# Patient Record
Sex: Male | Born: 1935 | Race: White | Hispanic: No
Health system: Southern US, Community
[De-identification: ages and names within clinical notes are randomized; demographics above are authoritative.]

---

## 2004-10-08 ENCOUNTER — Ambulatory Visit: Payer: Self-pay | Admitting: Unknown Physician Specialty

## 2011-01-10 ENCOUNTER — Observation Stay: Payer: Self-pay | Admitting: Internal Medicine

## 2011-03-06 ENCOUNTER — Ambulatory Visit: Payer: Self-pay | Admitting: Orthopedic Surgery

## 2011-03-11 ENCOUNTER — Ambulatory Visit: Payer: Self-pay | Admitting: Unknown Physician Specialty

## 2011-03-11 LAB — URINALYSIS, COMPLETE
Bilirubin,UR: NEGATIVE
Blood: NEGATIVE
Glucose,UR: >=500 mg/dL
Ketone: NEGATIVE
Nitrite: NEGATIVE
Ph: 5
Protein: NEGATIVE
RBC,UR: 3 /HPF
Specific Gravity: 1.026
Squamous Epithelial: NONE SEEN
WBC UR: 20 /HPF

## 2011-03-17 ENCOUNTER — Ambulatory Visit: Payer: Self-pay | Admitting: Unknown Physician Specialty

## 2011-03-19 LAB — PATHOLOGY REPORT

## 2012-05-01 ENCOUNTER — Inpatient Hospital Stay: Payer: Self-pay | Admitting: Internal Medicine

## 2012-05-01 LAB — CBC
HCT: 40.7 % (ref 40.0–52.0)
HGB: 13.7 g/dL (ref 13.0–18.0)
MCV: 95 fL (ref 80–100)
RBC: 4.27 10*6/uL — ABNORMAL LOW (ref 4.40–5.90)
RDW: 12.9 % (ref 11.5–14.5)
WBC: 7.4 10*3/uL (ref 3.8–10.6)

## 2012-05-01 LAB — COMPREHENSIVE METABOLIC PANEL
Anion Gap: 5 — ABNORMAL LOW (ref 7–16)
BUN: 19 mg/dL — ABNORMAL HIGH (ref 7–18)
Bilirubin,Total: 0.4 mg/dL (ref 0.2–1.0)
Calcium, Total: 8.7 mg/dL (ref 8.5–10.1)
Creatinine: 1.14 mg/dL (ref 0.60–1.30)
EGFR (African American): >60
EGFR (Non-African Amer.): 55 — ABNORMAL LOW
Glucose: 336 mg/dL — ABNORMAL HIGH (ref 65–99)
Osmolality: 284 (ref 275–301)
Potassium: 4.7 mmol/L (ref 3.5–5.1)
SGOT(AST): 16 U/L (ref 15–37)
SGPT (ALT): 17 U/L (ref 12–78)
Sodium: 134 mmol/L — ABNORMAL LOW (ref 136–145)

## 2012-05-01 LAB — URINALYSIS, COMPLETE
Bacteria: NONE SEEN
Bilirubin,UR: NEGATIVE
Blood: NEGATIVE
Glucose,UR: >=500 mg/dL (ref 0–75)
Leukocyte Esterase: NEGATIVE
Nitrite: NEGATIVE
Ph: 5 (ref 4.5–8.0)
Squamous Epithelial: <1

## 2012-05-02 LAB — BASIC METABOLIC PANEL
Calcium, Total: 8.4 mg/dL — ABNORMAL LOW (ref 8.5–10.1)
Chloride: 103 mmol/L (ref 98–107)
Co2: 31 mmol/L (ref 21–32)
Creatinine: 0.96 mg/dL (ref 0.60–1.30)
EGFR (African American): >60
EGFR (Non-African Amer.): >60
Glucose: 185 mg/dL — ABNORMAL HIGH (ref 65–99)
Potassium: 4 mmol/L (ref 3.5–5.1)

## 2012-05-02 LAB — LIPID PANEL
Cholesterol: 180 mg/dL (ref 0–200)
HDL Cholesterol: 50 mg/dL (ref 40–60)
Ldl Cholesterol, Calc: 103 mg/dL — ABNORMAL HIGH (ref 0–100)

## 2012-05-02 LAB — MAGNESIUM: Magnesium: 1.9 mg/dL

## 2012-08-09 ENCOUNTER — Inpatient Hospital Stay: Payer: Self-pay | Admitting: Internal Medicine

## 2012-08-09 LAB — COMPREHENSIVE METABOLIC PANEL
Anion Gap: 10 (ref 7–16)
Chloride: 105 mmol/L (ref 98–107)
Co2: 26 mmol/L (ref 21–32)
Creatinine: 1.45 mg/dL — ABNORMAL HIGH (ref 0.60–1.30)
EGFR (African American): 48 — ABNORMAL LOW
EGFR (Non-African Amer.): 41 — ABNORMAL LOW
Glucose: 269 mg/dL — ABNORMAL HIGH (ref 65–99)
Osmolality: 307 (ref 275–301)
SGOT(AST): 20 U/L (ref 15–37)
SGPT (ALT): 13 U/L (ref 12–78)
Sodium: 141 mmol/L (ref 136–145)

## 2012-08-09 LAB — CBC WITH DIFFERENTIAL/PLATELET
Basophil #: 0 10*3/uL (ref 0.0–0.1)
Basophil %: 0.3 %
Eosinophil #: 0 10*3/uL (ref 0.0–0.7)
Eosinophil %: 0.3 %
HCT: 36.6 % — ABNORMAL LOW (ref 40.0–52.0)
Lymphocyte #: 0.7 10*3/uL — ABNORMAL LOW (ref 1.0–3.6)
Lymphocyte %: 6 %
MCH: 32.4 pg (ref 26.0–34.0)
MCV: 95 fL (ref 80–100)
Monocyte #: 0.8 x10 3/mm (ref 0.2–1.0)
Monocyte %: 6.8 %
Neutrophil #: 10.3 10*3/uL — ABNORMAL HIGH (ref 1.4–6.5)
Platelet: 206 10*3/uL (ref 150–440)
RBC: 3.86 10*6/uL — ABNORMAL LOW (ref 4.40–5.90)
RDW: 13.1 % (ref 11.5–14.5)
WBC: 11.9 10*3/uL — ABNORMAL HIGH (ref 3.8–10.6)

## 2012-08-09 LAB — CK TOTAL AND CKMB (NOT AT ARMC)
CK, Total: 185 U/L (ref 35–232)
CK, Total: 201 U/L (ref 35–232)
CK-MB: 3.2 ng/mL (ref 0.5–3.6)

## 2012-08-09 LAB — LIPID PANEL
HDL Cholesterol: 44 mg/dL (ref 40–60)
Ldl Cholesterol, Calc: 79 mg/dL (ref 0–100)
Triglycerides: 196 mg/dL (ref 0–200)

## 2012-08-09 LAB — APTT: Activated PTT: 29.3 secs (ref 23.6–35.9)

## 2012-08-09 LAB — MAGNESIUM: Magnesium: 2 mg/dL

## 2012-08-10 LAB — CBC WITH DIFFERENTIAL/PLATELET
Eosinophil #: 0.2 10*3/uL (ref 0.0–0.7)
Eosinophil %: 2.3 %
HGB: 11.5 g/dL — ABNORMAL LOW (ref 13.0–18.0)
MCH: 32.5 pg (ref 26.0–34.0)
MCHC: 35 g/dL (ref 32.0–36.0)
MCV: 93 fL (ref 80–100)
Monocyte #: 1.1 x10 3/mm — ABNORMAL HIGH (ref 0.2–1.0)
Monocyte %: 11.2 %
Neutrophil #: 7.3 10*3/uL — ABNORMAL HIGH (ref 1.4–6.5)
Neutrophil %: 74.8 %
Platelet: 225 10*3/uL (ref 150–440)
RBC: 3.54 10*6/uL — ABNORMAL LOW (ref 4.40–5.90)
WBC: 9.8 10*3/uL (ref 3.8–10.6)

## 2012-08-10 LAB — BASIC METABOLIC PANEL
Anion Gap: 9 (ref 7–16)
Anion Gap: 7 (ref 7–16)
BUN: 37 mg/dL — ABNORMAL HIGH (ref 7–18)
Calcium, Total: 8.2 mg/dL — ABNORMAL LOW (ref 8.5–10.1)
Chloride: 103 mmol/L (ref 98–107)
Chloride: 111 mmol/L — ABNORMAL HIGH (ref 98–107)
Co2: 23 mmol/L (ref 21–32)
Creatinine: 1.1 mg/dL (ref 0.60–1.30)
EGFR (Non-African Amer.): 45 — ABNORMAL LOW
Osmolality: 287 (ref 275–301)
Potassium: 2.8 mmol/L — ABNORMAL LOW (ref 3.5–5.1)
Potassium: 5 mmol/L (ref 3.5–5.1)
Sodium: 138 mmol/L (ref 136–145)
Sodium: 141 mmol/L (ref 136–145)

## 2012-08-10 LAB — URINALYSIS, COMPLETE
Bilirubin,UR: NEGATIVE
Ketone: NEGATIVE
Leukocyte Esterase: NEGATIVE
Nitrite: NEGATIVE
Protein: 30
RBC,UR: 975 /HPF (ref 0–5)
WBC UR: 4 /HPF (ref 0–5)

## 2012-08-10 LAB — TROPONIN I: Troponin-I: 0.62 ng/mL — ABNORMAL HIGH

## 2012-08-10 LAB — CK-MB: CK-MB: 3.6 ng/mL (ref 0.5–3.6)

## 2012-08-10 LAB — CK: CK, Total: 197 U/L (ref 35–232)

## 2012-08-11 LAB — COMPREHENSIVE METABOLIC PANEL
Albumin: 2.2 g/dL — ABNORMAL LOW (ref 3.4–5.0)
Alkaline Phosphatase: 67 U/L (ref 50–136)
Anion Gap: 11 (ref 7–16)
BUN: 27 mg/dL — ABNORMAL HIGH (ref 7–18)
Bilirubin,Total: 0.4 mg/dL (ref 0.2–1.0)
Calcium, Total: 8.6 mg/dL (ref 8.5–10.1)
Chloride: 108 mmol/L — ABNORMAL HIGH (ref 98–107)
Co2: 22 mmol/L (ref 21–32)
Creatinine: 1.04 mg/dL (ref 0.60–1.30)
EGFR (African American): >60
Osmolality: 287 (ref 275–301)
Potassium: 3.5 mmol/L (ref 3.5–5.1)
SGPT (ALT): 11 U/L — ABNORMAL LOW (ref 12–78)
Total Protein: 5.5 g/dL — ABNORMAL LOW (ref 6.4–8.2)

## 2012-08-11 LAB — CBC WITH DIFFERENTIAL/PLATELET
Basophil #: 0 10*3/uL (ref 0.0–0.1)
Basophil %: 0.3 %
Eosinophil #: 0.2 10*3/uL (ref 0.0–0.7)
Lymphocyte %: 11.1 %
MCHC: 34.5 g/dL (ref 32.0–36.0)
MCV: 93 fL (ref 80–100)
Monocyte #: 0.9 x10 3/mm (ref 0.2–1.0)
Platelet: 229 10*3/uL (ref 150–440)
RBC: 3.76 10*6/uL — ABNORMAL LOW (ref 4.40–5.90)

## 2012-09-16 DEATH — deceased

## 2014-06-08 NOTE — Discharge Summary (Signed)
PATIENT NAME:  [name], [name] MR#:  [id number] DATE OF BIRTH:  1917/09/04  DATE OF ADMISSION:  05/01/2012 DATE OF DISCHARGE: 05/05/2012   DISCHARGE DIAGNOSES:  1.  Acute cerebellar infarct.  2.  Hypertension.  3.  Diabetes mellitus, insulin requiring, uncontrolled.  4.  Dementia, moderate.  5.  Glaucoma.   DISCHARGE MEDICATIONS: Aricept 10 mg at bedtime, Diovan 160 mg daily, omeprazole 20 mg daily, Senokot 1 daily, alprazolam 0.25 mg at bedtime, glipizide 10 mg b.i.d., sliding scale insulin and Lantus insulin 10 units at bedtime, Lipitor 20 mg daily, aspirin 325 mg daily and Norvasc 5 mg daily.    REASON FOR ADMISSION: A 81 year old male. who presents with ataxia. Please see H and P for HPI, past medical history, and physical exam.   HOSPITAL COURSE: The patient was admitted. MRI showed acute cerebellar infarct. Overall functionally, he was stable, but very weak with significant ataxia. He had not been on aspirin before and thus aspirin was added. Lipids were stable. No carotid disease was noted. Echocardiogram showed normal LV systolic function with mild MR. Overall he is stable and will be going to skilled nursing.   PROGNOSIS: Guarded.   ____________________________ Danella PentonMark F. Kingsten Enfield, MD mfm:aw D: 05/05/2012 07:32:08 ET T: 05/05/2012 07:38:17 ET JOB#: [id number]  cc: Danella PentonMark F. Ivelise Castillo, MD, <Dictator> Audon Heymann Sherlene ShamsF Edilberto Roosevelt MD ELECTRONICALLY SIGNED 05/05/2012 8:31

## 2014-06-08 NOTE — H&P (Signed)
DATE OF BIRTH:  05-09-1917  DATE OF ADMISSION:  05/01/2012  PRIMARY CARE PHYSICIAN:  Dr. Bethann PunchesMark Miller  REFERRING PHYSICIAN:  Dr. Margarita GrizzleWoodruff   CHIEF COMPLAINT:  Confusion and declining condition.   HISTORY OF PRESENT ILLNESS: A 81 year old Caucasian male with a history of hypertension, diabetes, dementia. The patient was sent from home due to confusion and declining condition. The patient is demented, unable to provide any information. According to patient's daughter,  patient has a history of hypertension, dementia and diabetes. The patient usually can walk, but has some problem with communication due to dementia. For the past one week, the patient's condition has been declining. The patient cannot walk, probably needs a walker and wheelchair. The patient was noted to have confusion and decreased mental status since yesterday. The patient could not even stand and get out of the wheelchair today, so the patient was sent from home to ED for further evaluation. The patient's CT scan of head shows hypoattenuation. Dr. Margarita GrizzleWoodruff, ED physician, admitted patient to rule out CVA and possible rehab placement.   PAST MEDICAL HISTORY: Hypertension, diabetes, colon adenomas, dementia, bowel obstruction, basal cell cancer, glaucoma.   PAST SURGICAL HISTORY: Spine DJD, with back surgery.   SOCIAL HISTORY: No smoking or drinking or illicit drugs. Living alone.   FAMILY HISTORY: Unknown.   REVIEW OF SYSTEMS: Unable to obtain due to patient's mental status.  ALLERGIES: None.  MEDICATIONS:   1. Ultram 50 mg p.o. every 6 hours p.r.n.  2. Senokot-S 50 mg-8.6 mg 1 tablet p.o. b.i.d.  3. Prilosec 20 mg p.o. 1 cap once a day.  4. Metformin 500 mg p.o. once a day.  5. Glipizide 10 mg p.o. b.i.d.  6. Donepezil 10 mg p.o. once a day.  7. Diovan 320 mg p.o. tablets once a day.  8. Norvasc 5 mg p.o. daily.  9. Aleve 220 mg p.o. tablets 2 tablets once a day.   VITAL SIGNS: Temperature 98, blood pressure 150/73,  and just now is 183/85, pulse 90, O2 saturation 97% in room air.   PHYSICAL EXAMINATION:  GENERAL: The patient is awake, but demented; only knows his name and date of birth.  Follows limited commands.  HEENT: Pupils round, equal, react to light. Moist oral mucosa. Clear oropharynx.  NECK: Supple. No JVD or carotid bruit. No lymphadenopathy. Mild thyromegaly.  CARDIOVASCULAR: S1, S2. Regular rate and rhythm. No murmurs or gallops.  PULMONARY: Bilateral air entry. No wheezing or rales. No use of accessory muscles to breathe.  ABDOMEN: Soft. No distention. No tenderness. No organomegaly. Bowel sounds present.  EXTREMITIES: No edema, clubbing or cyanosis. No calf tenderness. Strong bilateral pedal pulses.  SKIN: No rash or jaundice, but has poor turgor and dry skin.  NEUROLOGIC: The patient is awake, but demented; follows limited commands. No focal deficit. Power 3 to 4 out of 5.   LABORATORY DATA: Glucose 333, BUN 19, creatinine 1.4, sodium 134, potassium 4.7, chloride 100, bicarb 29. Troponin less than 0.02. Chest x-ray: No acute cardiopulmonary disease. CT scan of head shows no intracranial hemorrhage. Focal hypoattenuation in the inferior left cerebellum may represent age-indeterminate infarct. Further evaluation could be provided with MRI. WBC 7.4, hemoglobin 13.7, platelets 213. Urinalysis is negative. EKG showed normal sinus rhythm at 86 BPM.  IMPRESSIONS: 1. Altered mental status, possibly due to cerebrovascular accident or worsening dementia.  2. Rule out cerebrovascular accident.  3. Possible worsening dementia.  4. Hyponatremia.  5. Hypertension.  6. Diabetes, uncontrolled.   PLAN OF TREATMENT:  1. The patient will be admitted to telemetry floor. Will start aspiration and fall precautions, get a PT consult and case manager consult for possible rehab placement.  2. Will get an MRI of brain to rule out CVA, start aspirin 325 mg p.o. daily and start   Lipitor. Hold tramadol and Aleve,  which may cause confusion.  3. Hypertension, which is uncontrolled. Continue Diovan and Norvasc. Keep systolic blood pressure about 914.  4. Diabetes. We will start sliding scale. Hold metformin, glipizide. May give basal insulin, depending on patient's blood sugar.  5. Hyponatremia, which is possibly due to high blood sugar. Will give normal saline, fluid support. Follow BMP.  6. Dementia. Will continue donepezil.   7. GI and DVT prophylaxis.   Discussed the patient's condition and the plan of treatment with the patient's daughter. She was in agreement to the current treatment plan.   THE PATIENT'S CODE STATUS IS DNR.  TIME SPENT:  About 57 minutes.     ____________________________ Shaune Pollack, MD qc:mr D: 05/01/2012 16:06:00 ET T: 05/01/2012 18:38:40 ET JOB#: [id number]  cc: Shaune Pollack, MD, <Dictator> Shaune Pollack MD ELECTRONICALLY SIGNED 05/02/2012 16:20

## 2014-06-08 NOTE — Consult Note (Signed)
Chief Complaint:  Subjective/Chief Complaint Vert sleepy .Quietly resting but answers question appropriately .Pt is still lethargic but arousable. He denies cp or sob.   VITAL SIGNS/ANCILLARY NOTES: **Vital Signs.:   27-Jun-14 13:46  Vital Signs Type Q 4hr  Temperature Temperature (F) 97.4  Celsius 36.3  Temperature Source oral  Pulse Pulse 58  Respirations Respirations 18  Systolic BP Systolic BP 300  Diastolic BP (mmHg) Diastolic BP (mmHg) 66  Mean BP 81  Pulse Ox % Pulse Ox % 99  Pulse Ox Activity Level  At rest  Oxygen Delivery Room Air/ 21 %  *Intake and Output.:   27-Jun-14 07:11  Stool  Smear of soft stool.   Brief Assessment:  GEN well developed, no acute distress, cachectic, thin   Cardiac Regular  murmur present  -- LE edema  -- JVD  --Gallop   Respiratory normal resp effort  no use of accessory muscles  rhonchi   Gastrointestinal Normal   Gastrointestinal details normal Soft  Nontender  Nondistended   EXTR negative cyanosis/clubbing, negative edema   Lab Results: Hepatic:  26-Jun-14 06:46   Bilirubin, Total 0.4  Alkaline Phosphatase 67  SGPT (ALT)  11  SGOT (AST)  10  Total Protein, Serum  5.5  Albumin, Serum  2.2  Routine Chem:  26-Jun-14 06:46   Magnesium, Serum 1.8 (1.8-2.4 THERAPEUTIC RANGE: 4-7 mg/dL TOXIC: > 10 mg/dL  -----------------------)  Glucose, Serum  103  BUN  27  Creatinine (comp) 1.04  Sodium, Serum 141  Potassium, Serum 3.5  Chloride, Serum  108  CO2, Serum 22  Calcium (Total), Serum 8.6  Osmolality (calc) 287  eGFR (African American) >60  eGFR (Non-African American) >60 (eGFR values <64m/min/1.73 m2 may be an indication of chronic kidney disease (CKD). Calculated eGFR is useful in patients with stable renal function. The eGFR calculation will not be reliable in acutely ill patients when serum creatinine is changing rapidly. It is not useful in  patients on dialysis. The eGFR calculation may not be applicable to  patients at the low and high extremes of body sizes, pregnant women, and vegetarians.)  Anion Gap 11  Routine Hem:  26-Jun-14 06:46   WBC (CBC) 8.7  RBC (CBC)  3.76  Hemoglobin (CBC)  12.0  Hematocrit (CBC)  34.8  Platelet Count (CBC) 229  MCV 93  MCH 31.9  MCHC 34.5  RDW 12.9  Neutrophil % 76.3  Lymphocyte % 11.1  Monocyte % 10.0  Eosinophil % 2.3  Basophil % 0.3  Neutrophil #  6.6  Lymphocyte # 1.0  Monocyte # 0.9  Eosinophil # 0.2  Basophil # 0.0 (Result(s) reported on 11 Aug 2012 at 07:15AM.)   Radiology Results: XRay:    24-Jun-14 10:28, Chest Portable Single View  Chest Portable Single View   REASON FOR EXAM:    weakness  COMMENTS:       PROCEDURE: DXR - DXR PORTABLE CHEST SINGLE VIEW  - Aug 09 2012 10:28AM     RESULT: The lungs are clear. The heart and pulmonary vessels are normal.   The bony and mediastinal structures are unremarkable. There is no   effusion. There is no pneumothorax or evidence of congestive failure.    IMPRESSION:  No acute cardiopulmonary disease. No change since 05/01/2012.    Dictation Site: 2        Verified By: GSundra Aland M.D., MD  Cardiac Catherization:    24-Jun-14 10:54, Cardiac Catheterization  Cardiac Catheterization   [facility]  [address] [place], [place] [address]  [phone]     Cardiovascular Catheterization Comprehensive Report     Patient: Shadrack Brummitt  Study date: 08/09/2012  MR number: [id number]  Account number: [id number]     DOB: [birth year]  Age: 81 years  Gender: Male  Race: White  Height:  Weight: 139.7 lb     Interventional Cardiologist:  Lujean Amel, MD     SUMMARY:     -SPLANCHNIC AND RENAL VESSELS: Left renal: There was a 50 % stenosis.  Right renal: There was a diffuse 75 %stenosis.     -1ST LESION INTERVENTIONS: A stent was performed on the 99 % lesion in  the distal RCA. Following intervention there was a 0 % residual  stenosis.     -Summary: STEMI  IMI  Normal LVF  Normal Wall motion  EF=55%  Cors  L Diffuse small multivessel CAD 75/90%  Circ diffuse 75/90%  RCA 95% distal ->0 BMS  Successful PCI stent BMS to distal RCA  Bilat Renals shot for CRI  L 50% ostial/R 75% diffuse  Rec refer to vascular     CORONARY CIRCULATION: The coronary circulation is right dominant. LAD:  The vessel was small sized. Angiography showed severe  atherosclerosis. Circumflex: The vessel was small sized. Angiography  showed severe atherosclerosis. Distal RCA: There was a diffuse 99 %  stenosis. There was TIMI grade 3 flow through the vessel (brisk  flow). Right posterolateral segment: The vessel was small sized.  Angiography showed moderate atherosclerosis.     ABDOMINAL VESSELS: Left renal: There was a 50 % stenosis. Right renal:  There was a diffuse 75 % stenosis.     VENTRICLES: There were no left ventricular global or regional wall  motion abnormalities. The left ventricle was normal in size.     VALVES: AORTIC VALVE: The aortic valve was evaluated by left  ventriculography. The aortic valve appeared to be structurally  normal. The aortic valve leaflets exhibited normal thickness and  normal excursion. There was no aortic stenosis. MITRAL VALVE: The  mitral valve was evaluated by left ventriculography. The mitral valve  appeared grossly normal. The mitral leaflets exhibited normal  thickness and normal excursion. The mitral valve exhibited no  regurgitation.     HISTORY: No history of previous myocardial infarction. There was no  prior diagnosis of congestive heart failure. There was no history of  dyslipidemia. There was no family history of coronary artery disease.  PRIOR CARDIOVASCULAR PROCEDURES: No history of valve surgery,  coronary or graft percutaneous intervention, or coronary bypass  surgery.     PRIOR DIAGNOSTIC TEST RESULTS: No prior stress test is available.     PROCEDURES PERFORMED: Left heart catheterization  with  ventriculography. Intervention on distal RCA: stent.     PROCEDURE: The risks and alternatives of the procedures and conscious  sedation were explained to the patient and informedconsent was  obtained. The patient was brought to the cath lab and placed on the  table. The planned puncture sites were prepped and draped in the  usual sterile fashion.     -Right femoral artery access. The vessel was accessed, a wire was  threadedinto the vessel, and a was advanced over the wire into the  vessel.     -Left heart catheterization. A catheter was advanced to the ascending  aorta. Ventriculography was performed using power injection of  contrast agent.     LESION INTERVENTION: A stent was performed on the 99 % lesion in  the  distal RCA. Following intervention there was a 0 % residual stenosis.  This was an ACC/AHA "non-high risk" lesion for intervention. There  was TIMI 3 flow before the procedure and TIMI 3 flow after the  procedure. There was no acute vessel closure. There was no  perforation. There was no dissection.     -Vessel setup was performed. A HTF BMW .014 x 190cm wire was used to  cross the lesion.     -Vessel setup was performed. A 70F XB 4 SH guiding catheterwas used to  intubate the vessel.     -Balloon angioplasty was performed, using a Rx Trek 2.5 x 66m  balloon, with 2 inflation(s) and a maximum inflation pressure of 8  atm.     -A Mini Vision 2.5 X 18MM bare-metal stent was placed across the  lesion and deployed at a maximum inflation pressure of 9 atm.     PROCEDURE COMPLETION: TIMING: Test started at 11:28. Test concluded at  12:07. RADIATION EXPOSURE: Fluoroscopy time: 8.83 min. Fluoroscopy  dose: 1.345 Gray.  MEDICATIONS GIVEN: Midazolam, 0.5 mg, IV, at 11:36. Nitroglycerin, 200  mcg, intracoronary, at 11:49. Angiomax Bolus, 9.75 ml, IV, at 11:33.  Angiomax Drip, 22.2 ml, IV, at 11:34. Ticagrelor (Brilinta), 180 mg,  PO, at 12:01.  CONTRAST GIVEN:  Isovue 240 ml.     Prepared and signed by     DLujean Amel MD  Signed 08/12/2012 14:31:40     STUDY DIAGRAM     Angiographic findings  Native coronary lesions:   Distal RCA: Lesion 1: diffuse, 99 % stenosis.  Intervention results  Native coronary lesions:  stent of the 99 % stenosis indistal RCA. 0 % residual stenosis.  Stent: Mini Vision 2.5 X 18MM bare-metal.     HEMODYNAMIC TABLES     Pressures:  Baseline  Pressures:  - HR: 90  Pressures:  - Rhythm:  Pressures:  -- Aortic Pressure (S/D/M): 98/42/64  Pressures:  -- Left Ventricle (s/edp): 145/19/--     Outputs:  Baseline  Outputs:  -- CALCULATIONS[age]  Outputs:  -- CALCULATIONS: Sex: Male  Outputs:  -- CALCULATIONS: Weight in kg: 63.50  Cardiology:    24-Jun-14 10:33, ED ECG  Ventricular Rate 91  Atrial Rate 91  P-R Interval 150  QRS Duration 82  QT 362  QTc 445  P Axis 57  R Axis 14  T Axis 106  ECG interpretation   Normal sinus rhythm  Inferior infarct , possibly acute  Anterior infarct (cited on or before 09-Jan-2011)  T wave abnormality, consider lateral ischemia  *** ** ** ** * ACUTE MI  ** ** ** **  Abnormal ECG  When compared with ECG of 01-May-2012 13:29,  Inferior infarct is now Present  T wave inversion now evident in Anterior leads  ----------unconfirmed----------  Confirmed by OVERREAD, NOT (100), editor PEARSON, BARBARA ([id number]) on 08/10/2012 12:10:03 PM  ED ECG     24-Jun-14 10:35, ECG  Ventricular Rate 96  Atrial Rate 96  P-R Interval 122  QRS Duration 78  QT 366  QTc 462  P Axis 29  R Axis 13  T Axis 110  ECG interpretation   Sinus rhythm with Premature atrial complexes  Inferior infarct (cited on or before 01-May-2012)  Anterior infarct (cited on or before 09-Jan-2011)  T wave abnormality, consider lateral ischemia  *** ** ** ** * ACUTE MI  ** ** ** **  Abnormal ECG  When compared with ECG of  09-Aug-2012 10:33,  Premature atrial complexes are now  Present  ----------unconfirmed----------  Confirmed by OVERREAD, NOT (100), editor PEARSON, BARBARA (32) on 08/10/2012 12:10:09 PM  ECG     24-Jun-14 10:42, ECG  Ventricular Rate 87  Atrial Rate 87  P-R Interval 150  QRS Duration 84  QT 352  QTc 423  P Axis 56  R Axis 9  T Axis 109  ECG interpretation   Normal sinus rhythm  Inferior infarct (cited on or before 09-Aug-2012)  Anterior infarct (cited on or before 09-Jan-2011)  T wave abnormality, consider lateral ischemia  *** ** ** ** * ACUTE MI  ** ** ** **  Abnormal ECG  When compared with ECG of 09-Aug-2012 10:35,  QT has shortened  ----------unconfirmed----------  Confirmed by OVERREAD, NOT (100), editor PEARSON, BARBARA (32) on 08/10/2012 12:10:14 PM  ECG     24-Jun-14 10:47, ECG  Ventricular Rate 96  Atrial Rate 96  P-R Interval 150  QRS Duration 86  QT 378  QTc 477  P Axis 61  R Axis 7  T Axis 104  ECG interpretation   Sinus rhythm with occasional Premature ventricular complexes  Possible Inferior infarct (cited on or before 09-Aug-2012)  Anterolateral infarct (cited on or before 09-Jan-2011)  Abnormal ECG  When compared with ECG of 09-Aug-2012 10:42,  Premature ventricular complexes are now Present  Questionable change in initial forces of Lateral leads  QT has lengthened  ----------unconfirmed----------  Confirmed by OVERREAD, NOT (100), editor PEARSON, BARBARA (32) on 08/10/2012 12:10:21 PM  ECG     24-Jun-14 13:20, Echo Doppler  Echo Doppler   REASON FOR EXAM:      COMMENTS:       PROCEDURE: [place] - ECHO DOPPLER COMPLETE(TRANSTHOR)  - Aug 09 2012  1:20PM     RESULT: Echocardiogram Report    Patient Name:   LAAKEA PEREIRA Date of Exam: 08/09/2012  Medical Rec #:  [id number]        Custom1:  Date ofBirth:  11-13-1917     Height:  Patient Age:    52 years      Weight:       140.0 lb  Patient Gender: M             BSA:          1.59 m??    Indications: MI  Sonographer:    Sherrie Sport RDCS  Referring Phys:  Lujean Amel, D    Sonographer Comments:Technically very difficult study due to very poor   echo windows and Pt could not be positioned on left side -recovering from   heart cath.    Summary:   1. Left ventricular ejection fraction, by visual estimation, is 60 to   65%.   2. Normal global left ventricular systolic function.   3. Decreased left ventricular internal cavity size.   4. Mild mitral valve regurgitation.   5. Mild thickening of the posterior mitral valve leaflet.   6. Mild aortic valve sclerosis without stenosis.   7. Mild tricuspid regurgitation.  2D AND M-MODE MEASUREMENTS (normal ranges within parentheses):  Left Ventricle:          Normal  IVSd (2D):      0.77 cm (0.7-1.1)  LVPWd (2D):     0.87 cm (0.7-1.1) Aorta/LA:                  Normal  LVIDd (2D):     3.35 cm (3.4-5.7) Aortic Root (2D): 1.60 cm (2.4-3.7)  LVIDs (2D):     2.21 cm           Left Atrium (2D): 3.00 cm (1.9-4.0)  LV FS (2D):     34.0 %   (>25%)  LV EF (2D):     64.2 %   (>50%)                                    Right Ventricle:                  RVd (2D):        2.52 cm  SPECTRAL DOPPLER ANALYSIS (where applicable):  LVOT Vmax:  LVOT VTI:  LVOT Diameter: 2.10 cm  Pulmonic Valve:  PV Max Velocity: 1.09 m/s PV Max PG: 4.7 mmHg PV Mean PG:  PHYSICIAN INTERPRETATION:  Left Ventricle: The left ventricular internal cavity size was decreased.   LV septal wall thickness was normal. LV posterior wall thickness was   normal. Global LV systolic function was normal. Left ventricular ejection   fraction, by visual estimation, is 60 to65%.  Right Ventricle: The right ventricular size is normal.  Left Atrium: The left atrium is normal in size.  Right Atrium: The right atrium is normal in size.  Pericardium: There is no evidence of pericardial effusion.  Mitral Valve: The mitral valve is normal in structure. There is mild   thickening of the posterior mitral valve leaflet. Mild mitral valve   regurgitation  is seen.  Tricuspid Valve: The tricuspid valve is normal. Mild tricuspid   regurgitation is visualized.  Aortic Valve: The aortic valve is normal. Mild aortic valve sclerosis is     present, with no evidence of aortic valve stenosis.  Pulmonic Valve: The pulmonic valve is normal.    [place] MD  Electronically signed by [place] MD  Signature Date/Time: 08/09/2012/5:21:47 PM    *** Final ***    IMPRESSION: .        Verified By: Yolonda Kida, M.D., MD   Assessment/Plan:  Assessment/Plan:  Assessment IMP S/P STEMI IMI S/P PCI RCA BMS CAD Dementia Hyperlipidemia .   Plan PLAN Physical therpay ASA 81 mg daily Plavix 75 mg daily x 6 months low dose B-blockers ACE low dose Rec evaluae for possible placement Statin therapy? Rec conservative cardiac involvement Consider palliative care Cardiology will sign off now , reconsult if needed   Electronic Signatures: Yolonda Kida (MD)  (Signed 27-Jun-14 23:29)  Authored: Chief Complaint, VITAL SIGNS/ANCILLARY NOTES, Brief Assessment, Lab Results, Radiology Results, Assessment/Plan   Last Updated: 27-Jun-14 23:29 by Lujean Amel D (MD)

## 2014-06-08 NOTE — H&P (Signed)
PATIENT NAME:  [name], [name] MR#:  [id number] DATE OF BIRTH:  Dec 05, 1917  DATE OF ADMISSION:  08/09/2012  ADMITTING PHYSICIAN:  Gladstone Lighter, MD  PRIMARY CARE PHYSICIAN: Emily Filbert, MD   CHIEF COMPLAINT: Nausea, vomiting and weakness.   HISTORY OF PRESENT ILLNESS: [name] is a 81 year old elderly Caucasian male with past medical history significant for advanced dementia, hypertension, diabetes, recent history of stroke and also arthritis who was brought from home secondary to nausea, vomiting and also weakness going on for almost 10 days now. The patient does have advanced dementia and most of the history is obtained by daughter at bedside. According to daughter, the patient was here in the hospital in 04/2012, had a stroke which resulted in severe ataxia and falls, and he was transferred over to rehab and has been home for the past 2 months. With his dementia he sometimes identifies family and maintains conversation, but has confusion, periods of hallucinations and agitation as well. He had a good day about 10 days ago followed by episode of nausea, vomiting and diarrhea which progressed for more than a day and then he felt better for 1 day, but from then on his appetite has been severely decreased. He has poor p.o. intake and has been more weak, lethargic, not able to ambulate well.  His PCP was contacted and he was advised to come to the ER for possible dehydration and IV fluids. However, in the ED, his EKG did reveal he had ST-segment elevation myocardial infarction in the inferior leads and was taken over to the cath lab and had a bare metal stent placed for 95% occlusion in the right coronary artery. Post procedure medicine has been consulted for further management.   PAST MEDICAL HISTORY: 1.  Hypertension.  2.  Diabetes mellitus.  3.  Colon adenomas.  4.  Advanced Alzheimer's dementia.  5.  Glaucoma.  6.  Degenerative disk disease.  7.  Hard of hearing.  8.  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   PAST SURGICAL HISTORY:  1.  Kyphoplasty. 2.  Nasal surgery.   ALLERGIES: No known drug allergies.   CURRENT HOME MEDICATIONS: 1.  Glipizide 10 mg p.o. b.i.d.  2.  Omeprazole 20 mg p.o. daily.  3.  Diovan 80 mg p.o. daily.  4.  Tramadol 50 mg p.o. q. 6 hours p.r.n.  5.  Aricept 10 mg p.o. daily.  6.  Xanax 0.25 mg p.o. b.i.d. p.r.n.   SOCIAL HISTORY: Lives at home and daughter has been living with him for the past 1-1/2 years as his dementia has been progressing. For the past 2 months, after his stroke, he has been ambulating with the help of a walker most of the days, but some days they also use wheelchair. He quit smoking several years ago.  No alcohol use.   FAMILY HISTORY: Dad with diabetes and mom with stroke.  Dad also lived up to be 69.  REVIEW OF SYSTEMS:  Difficult to be obtained secondary to the patient's dementia.   PHYSICAL EXAMINATION: VITAL SIGNS: Temperature 98 degrees Fahrenheit, pulse 92, respirations 18, blood pressure 99/59 and pulse ox 90% on room air.  GENERAL: Very thin build, ill-nourished elderly male lying in bed, not in any acute distress.  HEENT: Normocephalic, atraumatic. Pupils equal, round and reacting to light. Anicteric sclerae. Extraocular movements intact. Oropharynx with very dry mucous membranes, but no erythema, mass or exudates. Nasopharynx is clear without any discharge or lesions.  NECK: Supple. No thyromegaly, JVD or carotid  bruits.  Normal range of motion without pain.  LUNGS: Moving air bilaterally. Decreased bibasilar breath sounds. No wheeze or crackles. No use of accessory muscles for breathing.  HEART:  S1 and S2 muffled heart sounds, 3/6 systolic murmur heard. No rubs or gallops.  ABDOMEN: Soft, nontender, nondistended. No hepatosplenomegaly. Normal bowel sounds.  EXTREMITIES: No pedal edema. No clubbing or cyanosis. Cold feet. No marking noted.  Very feeble dorsalis pedis pulses palpable on exam.  SKIN: No acne, rash or  lesions.  LYMPHATICS: No cervical or inguinal lymphadenopathy.  NEUROLOGIC: No facial deficits on exam. Unable to do a complete neuro exam secondary to the patient's noncooperation from his dementia. Facial symmetry is noted.  Able to move all 4 extremities without any focal deficits. However, gait is not tested because he just had the cardiac cath and according to daughter he is ataxic at baseline since his last stroke. Sensation seems to be intact.  PSYCHOLOGICAL: The patient is awake, oriented to self.  LABORATORY AND DIAGNOSTICS: WBC 11.9, hemoglobin 12.5, hematocrit 36.6, platelet count 206.   Sodium 141, potassium 4.2, chloride 105, bicarb 26, BUN 58, creatinine 1.45, glucose 269 and calcium of 8.5.    ALT 13, AST 20, alk phos 85, total bili 0.6 and albumin of 3.6. Magnesium 2.0. CK 185, CK-MB 1.9, troponin 0.38. INR is 1.0.   Chest x-ray is showing no acute cardiopulmonary disease noted. EKG is showing ST-segment elevation MI in the inferior leads.   ASSESSMENT AND PLAN: A 81 year old male with history of advanced dementia, hypertension, diabetes, arthritis and recent stroke brought from home secondary to nausea, vomiting and weakness. EKG here showed ST-segment elevation myocardial infarction.  1.  ST-segment elevation myocardial infarction status post cardiac cath and had a bare metal stent placed. Appreciate cardiology input. The patient is currently on aspirin and Plavix. Metoprolol and statin have also been added. 2.  Hypertension. Since metoprolol has been added, will hold off on Diovan at this time.  3.  Diabetes mellitus. Check HbA1c.  Continue his glipizide and he is on sliding scale insulin.  4.  Dementia. Continue Aricept. Care management consult has been requested along with physical therapy to see if the patient would be a candidate for skilled nursing facility transfer. Daughter requests same as well. 5.  Acute renal failure.  Last creatinine was 0.96 and creatinine today is  elevated at 1.45.  Likely secondary to acute tubular necrosis and prerenal or renal failure for nausea, vomiting and poor oral intake. Now that he had the cardiac cath and has received contrast as well that might damage his kidneys so will continue IV fluids and recheck creatinine in the a.m.  CODE STATUS: DO NOT RESUSCITATE, as the patient has an out of facility DNR form signed and placed in the chart.  TOTAL CRITICAL CARE TIME SPENT ON THIS ADMISSION:  60 minutes.  ____________________________ Gladstone Lighter, MD rk:sb D: 08/09/2012 12:53:25 ET T: 08/09/2012 13:20:11 ET JOB#: [id number]  cc: Gladstone Lighter, MD, <Dictator> Rusty Aus, MD Dwayne D. Clayborn Bigness, MD Gladstone Lighter MD ELECTRONICALLY SIGNED 08/09/2012 15:11

## 2014-06-08 NOTE — Consult Note (Signed)
Chief Complaint:  Subjective/Chief Complaint Quietly resting but answers question appropriately .Pt is still lethargic but arousable. He denies cp or sob.   VITAL SIGNS/ANCILLARY NOTES: **Vital Signs.:   26-Jun-14 08:06  Vital Signs Type Routine  Temperature Temperature (F) 97.9  Celsius 36.6  Temperature Source oral  Pulse Pulse 80  Respirations Respirations 16  Systolic BP Systolic BP 093  Diastolic BP (mmHg) Diastolic BP (mmHg) 64  Mean BP 77  Pulse Ox % Pulse Ox % 100  Pulse Ox Activity Level  At rest  Oxygen Delivery Room Air/ 21 %  *Intake and Output.:   Shift 26-Jun-14 15:00  Grand Totals Intake:  237 Output:      Net:  237 24 Hr.:  237  Length of Stay Totals Intake:  2687 Output:  2671    Net:  922   Brief Assessment:  (Data referenced from "Consultant's Daily Progress Note" 25-Jun-14 21:11)  GEN well developed, no acute distress, cachectic, thin   Cardiac Regular  murmur present  -- LE edema  -- JVD  --Gallop   Respiratory normal resp effort  no use of accessory muscles  rhonchi   Gastrointestinal Normal   Gastrointestinal details normal Soft  Nontender  Nondistended   EXTR negative cyanosis/clubbing, negative edema   Lab Results: Routine Chem:  25-Jun-14 04:45   Glucose, Serum  133  BUN  38  Creatinine (comp)  1.34  Sodium, Serum 138  Potassium, Serum  2.8  Chloride, Serum 103  CO2, Serum 26  Calcium (Total), Serum  8.2  Osmolality (calc) 287  eGFR (African American)  53  eGFR (Non-African American)  45 (eGFR values <18m/min/1.73 m2 may be an indication of chronic kidney disease (CKD). Calculated eGFR is useful in patients with stable renal function. The eGFR calculation will not be reliable in acutely ill patients when serum creatinine is changing rapidly. It is not useful in  patients on dialysis. The eGFR calculation may not be applicable to patients at the low and high extremes of body sizes, pregnant women, and vegetarians.)  Anion Gap 9     10:54   Result Comment TROPONIN - RESULTS VERIFIED BY REPEAT TESTING.  - PREV CALLED 08/09/12 @ 1205 VKB..CDF  Result(s) reported on 10 Aug 2012 at 11:46AM.    12:36   Glucose, Serum  115  BUN  37  Creatinine (comp) 1.10  Sodium, Serum 141  Potassium, Serum -  Potassium, Serum 5.0  Chloride, Serum  111  CO2, Serum 23  Calcium (Total), Serum  7.9  Osmolality (calc) 291  eGFR (African American) >60  eGFR (Non-African American)  58 (eGFR values <615mmin/1.73 m2 may be an indication of chronic kidney disease (CKD). Calculated eGFR is useful in patients with stable renal function. The eGFR calculation will not be reliable in acutely ill patients when serum creatinine is changing rapidly. It is not useful in  patients on dialysis. The eGFR calculation may not be applicable to patients at the low and high extremes of body sizes, pregnant women, and vegetarians.)  Anion Gap 7  Result Comment potassium - INCLUDED IN METB  Result(s) reported on 10 Aug 2012 at 12:48PM.  Result Comment POTASSIUM - Slight hemolysis, interpret results with  - caution.  Result(s) reported on 10 Aug 2012 at 01:00PM.  Cardiac:  25-Jun-14 10:54   Troponin I  0.62 (0.00-0.05 0.05 ng/mL or less: NEGATIVE  Repeat testing in 3-6 hrs  if clinically indicated. >0.05 ng/mL: POTENTIAL  MYOCARDIAL INJURY. Repeat  testing in  3-6 hrs if  clinically indicated. NOTE: An increase or decrease  of 30% or more on serial  testing suggests a  clinically important change)  CPK-MB, Serum 3.6 (Result(s) reported on 10 Aug 2012 at 11:39AM.)  CK, Total 197 (Result(s) reported on 10 Aug 2012 at 11:39AM.)  Routine UA:  25-Jun-14 03:54   Color (UA) Yellow  Clarity (UA) Hazy  Glucose (UA) 50 mg/dL  Bilirubin (UA) Negative  Ketones (UA) Negative  Specific Gravity (UA) 1.056  Blood (UA) 3+  pH (UA) 6.0  Protein (UA) 30 mg/dL  Nitrite (UA) Negative  Leukocyte Esterase (UA) Negative (Result(s) reported on 10 Aug 2012 at  04:18AM.)  RBC (UA) 975 /HPF  WBC (UA) 4 /HPF  Bacteria (UA) NONE SEEN  Epithelial Cells (UA) NONE SEEN  Result(s) reported on 10 Aug 2012 at 04:18AM.  Routine Hem:  25-Jun-14 04:45   WBC (CBC) 9.8  RBC (CBC)  3.54  Hemoglobin (CBC)  11.5  Hematocrit (CBC)  32.9  Platelet Count (CBC) 225  MCV 93  MCH 32.5  MCHC 35.0  RDW 12.9  Neutrophil % 74.8  Lymphocyte % 11.4  Monocyte % 11.2  Eosinophil % 2.3  Basophil % 0.3  Neutrophil #  7.3  Lymphocyte # 1.1  Monocyte #  1.1  Eosinophil # 0.2  Basophil # 0.0 (Result(s) reported on 10 Aug 2012 at 05:37AM.)   Radiology Results: XRay:    24-Jun-14 10:28, Chest Portable Single View  Chest Portable Single View   REASON FOR EXAM:    weakness  COMMENTS:       PROCEDURE: DXR - DXR PORTABLE CHEST SINGLE VIEW  - Aug 09 2012 10:28AM     RESULT: The lungs are clear. The heart and pulmonary vessels are normal.   The bony and mediastinal structures are unremarkable. There is no   effusion. There is no pneumothorax or evidence of congestive failure.    IMPRESSION:  No acute cardiopulmonary disease. No change since 05/01/2012.    Dictation Site: 2        Verified By: Sundra Aland, M.D., MD  Cardiology:    24-Jun-14 10:33, ED ECG  Ventricular Rate 91  Atrial Rate 91  P-R Interval 150  QRS Duration 82  QT 362  QTc 445  P Axis 57  R Axis 14  T Axis 106  ECG interpretation   Normal sinus rhythm  Inferior infarct , possibly acute  Anterior infarct (cited on or before 09-Jan-2011)  T wave abnormality, consider lateral ischemia  *** ** ** ** * ACUTE MI  ** ** ** **  Abnormal ECG  When compared with ECG of 01-May-2012 13:29,  Inferior infarct is now Present  T wave inversion now evident in Anterior leads  ----------unconfirmed----------  Confirmed by OVERREAD, NOT ([id number]), editor PEARSON, BARBARA ([id number]) on 08/10/2012 12:10:03 PM  ED ECG     24-Jun-14 10:35, ECG  Ventricular Rate 96  Atrial Rate 96  P-R Interval 122  QRS  Duration 78  QT 366  QTc 462  P Axis 29  R Axis 13  T Axis 110  ECG interpretation   Sinus rhythm with Premature atrial complexes  Inferior infarct (cited on or before 01-May-2012)  Anterior infarct (cited on or before 09-Jan-2011)  T wave abnormality, consider lateral ischemia  *** ** ** ** * ACUTE MI  ** ** ** **  Abnormal ECG  When compared with ECG of 09-Aug-2012 10:33,  Premature atrial complexes are now Present  ----------unconfirmed----------  Confirmed by OVERREAD, NOT ([id number]), editor PEARSON, BARBARA ([id number]) on 08/10/2012 12:10:09 PM  ECG     24-Jun-14 10:42, ECG  Ventricular Rate 87  Atrial Rate 87  P-R Interval 150  QRS Duration 84  QT 352  QTc 423  P Axis 56  R Axis 9  T Axis 109  ECG interpretation   Normal sinus rhythm  Inferior infarct (cited on or before 09-Aug-2012)  Anterior infarct (cited on or before 09-Jan-2011)  T wave abnormality, consider lateral ischemia  *** ** ** ** * ACUTE MI  ** ** ** **  Abnormal ECG  When compared with ECG of 09-Aug-2012 10:35,  QT has shortened  ----------unconfirmed----------  Confirmed by OVERREAD, NOT ([id number]), editor PEARSON, BARBARA ([id number]) on 08/10/2012 12:10:14 PM  ECG     24-Jun-14 10:47, ECG  Ventricular Rate 96  Atrial Rate 96  P-R Interval 150  QRS Duration 86  QT 378  QTc 477  P Axis 61  R Axis 7  T Axis 104  ECG interpretation   Sinus rhythm with occasional Premature ventricular complexes  Possible Inferior infarct (cited on or before 09-Aug-2012)  Anterolateral infarct (cited on or before 09-Jan-2011)  Abnormal ECG  When compared with ECG of 09-Aug-2012 10:42,  Premature ventricular complexes are now Present  Questionable change in initial forces of Lateral leads  QT has lengthened  ----------unconfirmed----------  Confirmed by OVERREAD, NOT (100), editor PEARSON, BARBARA ([id number]) on 08/10/2012 12:10:21 PM  ECG     24-Jun-14 13:20, Echo Doppler  Echo Doppler   REASON FOR EXAM:      COMMENTS:        PROCEDURE: [place] - ECHO DOPPLER COMPLETE(TRANSTHOR)  - Aug 09 2012  1:20PM     RESULT: Echocardiogram Report    Patient Name:   [name] Date of Exam: 08/09/2012  Medical Rec #:  [id number]        Custom1:  Date ofBirth:  [birth year]     Height:  Patient Age:    81 years      Weight:       140.0 lb  Patient Gender: M             BSA:          1.59 m??    Indications: MI  Sonographer:    Sherrie Sport RDCS  Referring Phys: Lujean Amel, D    Sonographer Comments:Technically very difficult study due to very poor   echo windows and Pt could not be positioned on left side -recovering from   heart cath.    Summary:   1. Left ventricular ejection fraction, by visual estimation, is 60 to   65%.   2. Normal global left ventricular systolic function.   3. Decreased left ventricular internal cavity size.   4. Mild mitral valve regurgitation.   5. Mild thickening of the posterior mitral valve leaflet.   6. Mild aortic valve sclerosis without stenosis.   7. Mild tricuspid regurgitation.  2D AND M-MODE MEASUREMENTS (normal ranges within parentheses):  Left Ventricle:          Normal  IVSd (2D):      0.77 cm (0.7-1.1)  LVPWd (2D):     0.87 cm (0.7-1.1) Aorta/LA:                  Normal  LVIDd (2D):     3.35 cm (3.4-5.7) Aortic Root (2D): 1.60 cm (2.4-3.7)  LVIDs (2D):     2.21 cm  Left Atrium (2D): 3.00 cm (1.9-4.0)  LV FS (2D):     34.0 %   (>25%)  LV EF (2D):     64.2 %   (>50%)                                    Right Ventricle:                  RVd (2D):        2.52 cm  SPECTRAL DOPPLER ANALYSIS (where applicable):  LVOT Vmax:  LVOT VTI:  LVOT Diameter: 2.10 cm  Pulmonic Valve:  PV Max Velocity: 1.09 m/s PV Max PG: 4.7 mmHg PV Mean PG:  PHYSICIAN INTERPRETATION:  Left Ventricle: The left ventricular internal cavity size was decreased.   LV septal wall thickness was normal. LV posterior wall thickness was   normal. Global LV systolic function was normal. Left ventricular  ejection   fraction, by visual estimation, is 60 to65%.  Right Ventricle: The right ventricular size is normal.  Left Atrium: The left atrium is normal in size.  Right Atrium: The right atrium is normal in size.  Pericardium: There is no evidence of pericardial effusion.  Mitral Valve: The mitral valve is normal in structure. There is mild   thickening of the posterior mitral valve leaflet. Mild mitral valve   regurgitation is seen.  Tricuspid Valve: The tricuspid valve is normal. Mild tricuspid   regurgitation is visualized.  Aortic Valve: The aortic valve is normal. Mild aortic valve sclerosis is     present, with no evidence of aortic valve stenosis.  Pulmonic Valve: The pulmonic valve is normal.    [place] MD  Electronically signed by [place] MD  Signature Date/Time: 08/09/2012/5:21:47 PM    *** Final ***    IMPRESSION: .        Verified By: Yolonda Kida, M.D., MD   Assessment/Plan:  Assessment/Plan:  Assessment IMP S/P STEMI IMI S/P PCI RCA BMS CAD Dementia Hyperlipidemia .   Plan PLAN Physical therpay ASA 81 mg daily Plavix 75 mg daily x 6 months low dose B-blockers ACE low dose Rec evaluae for possible placement Statin therapy? Rec conservative cardiac involvement   Electronic Signatures: Yolonda Kida (MD)  (Signed 27-Jun-14 07:32)  Authored: Chief Complaint, VITAL SIGNS/ANCILLARY NOTES, Brief Assessment, Lab Results, Radiology Results, Assessment/Plan   Last Updated: 27-Jun-14 07:32 by Lujean Amel D (MD)

## 2014-06-08 NOTE — Discharge Summary (Signed)
PATIENT NAME:  [name], [name] DATE OF BIRTH:  10/12/1917  DATE OF ADMISSION:  08/09/2012 DATE OF DISCHARGE:    DISCHARGE DIAGNOSES:  1.  Acute inferior myocardial infarction with percutaneous coronary intervention of the right coronary artery. 2.  End-stage dementia, Alzheimer's type.  3.  Diabetes mellitus, insulin requiring.  4.  Osteoarthritis.   DISCHARGE MEDICATIONS: Aricept 10 mg at bedtime, aspirin 325 mg daily, Lantus insulin 10 units at bedtime, glipizide 10 mg b.i.d., simvastatin 20 mg at bedtime, Plavix 75 mg daily and metoprolol tartrate 25 mg one half b.i.d.   REASON FOR ADMISSION: A 81 year old male presents with nausea, vomiting and acute inferior MI. Please see H and P, history of present illness, past medical history, and physical exam.   HOSPITAL COURSE: The patient was admitted, found to have ST elevated infarction in the inferior leads. He underwent emergent PCI with good results in the RCA. He was stable post PCI. Dementia is really his other main issue. Overall, his functional capacity has declined. He is really not able to go home and will pursue assistive versus skilled nursing. Prognosis is limited with his dementia.  ____________________________ Daryl PentonMark Daryl. Veida Spira, MD [id number] D: 08/16/2012 08:09:43 ET T: 08/16/2012 08:30:21 ET JOB#: [id number]  cc: Daryl PentonMark Daryl. Jobany Montellano, MD, <Dictator> Daryl Freund Sherlene ShamsF Duilio Heritage MD ELECTRONICALLY SIGNED 08/17/2012 8:11

## 2014-06-08 NOTE — Consult Note (Signed)
Chief Complaint:  Subjective/Chief Complaint Pt is still lethargic but arousable. He denies cp or sob   VITAL SIGNS/ANCILLARY NOTES: **Vital Signs.:   25-Jun-14 10:49  Vital Signs Type Upon Transfer  Temperature Temperature (F) 97.7  Celsius 36.5  Temperature Source oral  Pulse Pulse 142  Respirations Respirations 19  Systolic BP Systolic BP 366  Diastolic BP (mmHg) Diastolic BP (mmHg) 57  Mean BP 78  Pulse Ox % Pulse Ox % 92  Pulse Ox Activity Level  At rest  Oxygen Delivery Room Air/ 21 %  Pulse Ox Heart Rate 66  *Intake and Output.:   25-Jun-14 10:42  Grand Totals Intake:  200 Output:      Net:  200 24 Hr.:  200  IV (Primary)      In:  200   Brief Assessment:  GEN well developed, no acute distress, cachectic, thin   Cardiac Regular  murmur present  -- LE edema  -- JVD  --Gallop   Respiratory normal resp effort  no use of accessory muscles  rhonchi   Gastrointestinal Normal   Gastrointestinal details normal Soft  Nontender  Nondistended   EXTR negative cyanosis/clubbing, negative edema   Lab Results: Routine Chem:  25-Jun-14 04:45   Potassium, Serum  2.8  Glucose, Serum  133  BUN  38  Creatinine (comp)  1.34  Sodium, Serum 138  Chloride, Serum 103  CO2, Serum 26  Calcium (Total), Serum  8.2  Anion Gap 9  Osmolality (calc) 287  eGFR (African American)  53  eGFR (Non-African American)  45 (eGFR values <23m/min/1.73 m2 may be an indication of chronic kidney disease (CKD). Calculated eGFR is useful in patients with stable renal function. The eGFR calculation will not be reliable in acutely ill patients when serum creatinine is changing rapidly. It is not useful in  patients on dialysis. The eGFR calculation may not be applicable to patients at the low and high extremes of body sizes, pregnant women, and vegetarians.)    10:54   Result Comment TROPONIN - RESULTS VERIFIED BY REPEAT TESTING.  - PREV CALLED 08/09/12 @ 1205 VKB..CDF  Result(s) reported on  10 Aug 2012 at 11:46AM.    12:36   Result Comment potassium - INCLUDED IN METB  Result(s) reported on 10 Aug 2012 at 12:48PM.  Result Comment POTASSIUM - Slight hemolysis, interpret results with  - caution.  Result(s) reported on 10 Aug 2012 at 01:00PM.  Potassium, Serum -  Potassium, Serum 5.0  Glucose, Serum  115  BUN  37  Creatinine (comp) 1.10  Sodium, Serum 141  Chloride, Serum  111  CO2, Serum 23  Calcium (Total), Serum  7.9  Anion Gap 7  Osmolality (calc) 291  eGFR (African American) >60  eGFR (Non-African American)  58 (eGFR values <662mmin/1.73 m2 may be an indication of chronic kidney disease (CKD). Calculated eGFR is useful in patients with stable renal function. The eGFR calculation will not be reliable in acutely ill patients when serum creatinine is changing rapidly. It is not useful in  patients on dialysis. The eGFR calculation may not be applicable to patients at the low and high extremes of body sizes, pregnant women, and vegetarians.)  Cardiac:  25-Jun-14 10:54   Troponin I  0.62 (0.00-0.05 0.05 ng/mL or less: NEGATIVE  Repeat testing in 3-6 hrs  if clinically indicated. >0.05 ng/mL: POTENTIAL  MYOCARDIAL INJURY. Repeat  testing in 3-6 hrs if  clinically indicated. NOTE: An increase or decrease  of 30% or more  on serial  testing suggests a  clinically important change)  CPK-MB, Serum 3.6 (Result(s) reported on 10 Aug 2012 at 11:39AM.)  CK, Total 197 (Result(s) reported on 10 Aug 2012 at 11:39AM.)  Routine UA:  25-Jun-14 03:54   Color (UA) Yellow  Clarity (UA) Hazy  Glucose (UA) 50 mg/dL  Bilirubin (UA) Negative  Ketones (UA) Negative  Specific Gravity (UA) 1.056  Blood (UA) 3+  pH (UA) 6.0  Protein (UA) 30 mg/dL  Nitrite (UA) Negative  Leukocyte Esterase (UA) Negative (Result(s) reported on 10 Aug 2012 at 04:18AM.)  RBC (UA) 975 /HPF  WBC (UA) 4 /HPF  Bacteria (UA) NONE SEEN  Epithelial Cells (UA) NONE SEEN  Result(s) reported on 10 Aug 2012 at 04:18AM.  Routine Hem:  25-Jun-14 04:45   WBC (CBC) 9.8  RBC (CBC)  3.54  Hemoglobin (CBC)  11.5  Hematocrit (CBC)  32.9  Platelet Count (CBC) 225  MCV 93  MCH 32.5  MCHC 35.0  RDW 12.9  Neutrophil % 74.8  Lymphocyte % 11.4  Monocyte % 11.2  Eosinophil % 2.3  Basophil % 0.3  Neutrophil #  7.3  Lymphocyte # 1.1  Monocyte #  1.1  Eosinophil # 0.2  Basophil # 0.0 (Result(s) reported on 10 Aug 2012 at 05:37AM.)   Radiology Results: XRay:    24-Jun-14 10:28, Chest Portable Single View  Chest Portable Single View   REASON FOR EXAM:    weakness  COMMENTS:       PROCEDURE: DXR - DXR PORTABLE CHEST SINGLE VIEW  - Aug 09 2012 10:28AM     RESULT: The lungs are clear. The heart and pulmonary vessels are normal.   The bony and mediastinal structures are unremarkable. There is no   effusion. There is no pneumothorax or evidence of congestive failure.    IMPRESSION:  No acute cardiopulmonary disease. No change since 05/01/2012.    Dictation Site: 2        Verified By: Sundra Aland, M.D., MD  Cardiology:    24-Jun-14 10:33, ED ECG  Ventricular Rate 91  Atrial Rate 91  P-R Interval 150  QRS Duration 82  QT 362  QTc 445  P Axis 57  R Axis 14  T Axis 106  ECG interpretation   Normal sinus rhythm  Inferior infarct , possibly acute  Anterior infarct (cited on or before 09-Jan-2011)  T wave abnormality, consider lateral ischemia  *** ** ** ** * ACUTE MI  ** ** ** **  Abnormal ECG  When compared with ECG of 01-May-2012 13:29,  Inferior infarct is now Present  T wave inversion now evident in Anterior leads  ----------unconfirmed----------  Confirmed by OVERREAD, NOT ([id number]), editor PEARSON, BARBARA ([id number]) on 08/10/2012 12:10:03 PM  ED ECG     24-Jun-14 10:35, ECG  Ventricular Rate 96  Atrial Rate 96  P-R Interval 122  QRS Duration 78  QT 366  QTc 462  P Axis 29  R Axis 13  T Axis 110  ECG interpretation   Sinus rhythm with Premature atrial  complexes  Inferior infarct (cited on or before 01-May-2012)  Anterior infarct (cited on or before 09-Jan-2011)  T wave abnormality, consider lateral ischemia  *** ** ** ** * ACUTE MI  ** ** ** **  Abnormal ECG  When compared with ECG of 09-Aug-2012 10:33,  Premature atrial complexes are now Present  ----------unconfirmed----------  Confirmed by OVERREAD, NOT ([id number]), editor PEARSON, BARBARA ([id number]) on 08/10/2012 12:10:09 PM  ECG  24-Jun-14 10:42, ECG  Ventricular Rate 87  Atrial Rate 87  P-R Interval 150  QRS Duration 84  QT 352  QTc 423  P Axis 56  R Axis 9  T Axis 109  ECG interpretation   Normal sinus rhythm  Inferior infarct (cited on or before 09-Aug-2012)  Anterior infarct (cited on or before 09-Jan-2011)  T wave abnormality, consider lateral ischemia  *** ** ** ** * ACUTE MI  ** ** ** **  Abnormal ECG  When compared with ECG of 09-Aug-2012 10:35,  QT has shortened  ----------unconfirmed----------  Confirmed by OVERREAD, NOT ([id number]), editor PEARSON, BARBARA ([id number]) on 08/10/2012 12:10:14 PM  ECG     24-Jun-14 10:47, ECG  Ventricular Rate 96  Atrial Rate 96  P-R Interval 150  QRS Duration 86  QT 378  QTc 477  P Axis 61  R Axis 7  T Axis 104  ECG interpretation   Sinus rhythm with occasional Premature ventricular complexes  Possible Inferior infarct (cited on or before 09-Aug-2012)  Anterolateral infarct (cited on or before 09-Jan-2011)  Abnormal ECG  When compared with ECG of 09-Aug-2012 10:42,  Premature ventricular complexes are now Present  Questionable change in initial forces of Lateral leads  QT has lengthened  ----------unconfirmed----------  Confirmed by OVERREAD, NOT (100), editor PEARSON, BARBARA ([id number]) on 08/10/2012 12:10:21 PM  ECG     24-Jun-14 13:20, Echo Doppler  Echo Doppler   REASON FOR EXAM:      COMMENTS:       PROCEDURE: [place] - ECHO DOPPLER COMPLETE(TRANSTHOR)  - Aug 09 2012  1:20PM     RESULT: Echocardiogram Report    Patient Name:    [name] Date of Exam: 08/09/2012  Medical Rec #:  [id number]        Custom1:  Date ofBirth:  03-Jun-1917     Height:  Patient Age:    81 years      Weight:       140.0 lb  Patient Gender: M             BSA:          1.59 m??    Indications: MI  Sonographer:    Sherrie Sport RDCS  Referring Phys: Lujean Amel, D    Sonographer Comments:Technically very difficult study due to very poor   echo windows and Pt could not be positioned on left side -recovering from   heart cath.    Summary:   1. Left ventricular ejection fraction, by visual estimation, is 60 to   65%.   2. Normal global left ventricular systolic function.   3. Decreased left ventricular internal cavity size.   4. Mild mitral valve regurgitation.   5. Mild thickening of the posterior mitral valve leaflet.   6. Mild aortic valve sclerosis without stenosis.   7. Mild tricuspid regurgitation.  2D AND M-MODE MEASUREMENTS (normal ranges within parentheses):  Left Ventricle:          Normal  IVSd (2D):      0.77 cm (0.7-1.1)  LVPWd (2D):     0.87 cm (0.7-1.1) Aorta/LA:                  Normal  LVIDd (2D):     3.35 cm (3.4-5.7) Aortic Root (2D): 1.60 cm (2.4-3.7)  LVIDs (2D):     2.21 cm           Left Atrium (2D): 3.00 cm (1.9-4.0)  LV FS (2D):  34.0 %   (>25%)  LV EF (2D):     64.2 %   (>50%)                                    Right Ventricle:                  RVd (2D):        2.52 cm  SPECTRAL DOPPLER ANALYSIS (where applicable):  LVOT Vmax:  LVOT VTI:  LVOT Diameter: 2.10 cm  Pulmonic Valve:  PV Max Velocity: 1.09 m/s PV Max PG: 4.7 mmHg PV Mean PG:  PHYSICIAN INTERPRETATION:  Left Ventricle: The left ventricular internal cavity size was decreased.   LV septal wall thickness was normal. LV posterior wall thickness was   normal. Global LV systolic function was normal. Left ventricular ejection   fraction, by visual estimation, is 60 to65%.  Right Ventricle: The right ventricular size is normal.  Left Atrium: The  left atrium is normal in size.  Right Atrium: The right atrium is normal in size.  Pericardium: There is no evidence of pericardial effusion.  Mitral Valve: The mitral valve is normal in structure. There is mild   thickening of the posterior mitral valve leaflet. Mild mitral valve   regurgitation is seen.  Tricuspid Valve: The tricuspid valve is normal. Mild tricuspid   regurgitation is visualized.  Aortic Valve: The aortic valve is normal. Mild aortic valve sclerosis is     present, with no evidence of aortic valve stenosis.  Pulmonic Valve: The pulmonic valve is normal.    [place] MD  Electronically signed by [place] MD  Signature Date/Time: 08/09/2012/5:21:47 PM    *** Final ***    IMPRESSION: .        Verified By: Yolonda Kida, M.D., MD   Assessment/Plan:  Assessment/Plan:  Assessment IMP S/P STEMI IMI S/P PCI RCA BMS CAD Dementia Hyperlipidemia .   Plan PLAN ASA 81 mg daily Plavix 75 mg daily 02 [place] low dose B-blockers ACE low dose Rec evaluae for possible placement Statin therapy Rec conservative cardiac involvemen   Electronic Signatures: Lujean Amel D (MD)  (Signed 25-Jun-14 21:21)  Authored: Chief Complaint, VITAL SIGNS/ANCILLARY NOTES, Brief Assessment, Lab Results, Radiology Results, Assessment/Plan   Last Updated: 25-Jun-14 21:21 by Lujean Amel D (MD)

## 2014-06-08 NOTE — Consult Note (Signed)
Brief Consult Note: Diagnosis: STEMI IMI/Dementia/Recent CVA.   Patient was seen by consultant.   Consult note dictated.   Recommend to proceed with surgery or procedure.   Recommend further assessment or treatment.   Orders entered.   Discussed with Attending MD.   Comments: IMP STEMI IMI CVA recent HTN DM Hyperlipidemia Dementia . PLAN ASA 325 mg daily Plavix load then 75 daily Heparin IV as per nomogram Fluid bolus for hypotension Cardiac cath emergently Plan for intervention Medical consult for management.  Electronic Signatures: Dorothyann Pengallwood, Dwayne D (MD)  (Signed 25-Jun-14 05:52)  Authored: Brief Consult Note   Last Updated: 25-Jun-14 05:52 by Dorothyann Pengallwood, Dwayne D (MD)

## 2014-06-10 NOTE — Op Note (Signed)
PATIENT NAME:  [name], [name] MR#:  [id number] DATE OF BIRTH:  1917-07-19  DATE OF PROCEDURE:  03/17/2011  PREOPERATIVE DIAGNOSIS: Compression fracture of T12 with persistent pain.   POSTOPERATIVE DIAGNOSIS: Compression fracture of T12 with persistent pain.   PROCEDURES:  1. Kyphoplasty with biopsy injection of PMMA cement, T12. 2. Fluoroscopic interpretation of trocars placed for kyphoplasty and biopsy of T12.   SURGEON: Terrell Shimko C. Wanetta Funderburke, MD   ASSISTANT: None.   ANESTHESIA: MAC.   ESTIMATED BLOOD LOSS: Negligible.   COMPLICATIONS: None.   SPECIMENS SENT: Tissue from T12.   BRIEF CLINICAL AND PATHOLOGY: The patient unrelenting back pain unresponsive to conservative treatment. A work-up showed evidence of a T12 compression fracture. Options, risks, and benefits were discussed; and the patient and his family elected to proceed with kyphoplasty. At the time of the procedure, good bone specimen was obtained.   DESCRIPTION OF PROCEDURE: Preop antibiotics, adequate MAC anesthesia, prone position, all prominences well padded. Routine prepping and draping. Appropriate timeout was called.   The T12 vertebral body was identified with the fluoroscopy unit, the pedicles were isolated and appropriately aligned. The vertebral body was instrumented from the right side in routine fashion using fluoroscopic guidance. The trocar was passed into the vertebral body. A biopsy was obtained. The balloon was then inserted and the balloon was inflated. The cement was then injected when it was of the appropriate consistency. There was minimal extravasation. The cannula was cleaned and removed and the incision closed with Monocryl. A Band-Aid was applied. The patient was awakened and taken to the postanesthesia care unit having tolerated the procedure well.    ____________________________ Winn JockJames C. Gerrit Heckaliff, MD jcc:cbb D: 03/17/2011 11:53:02 ET T: 03/17/2011 12:34:19 ET JOB#: [id number]  cc: Winn JockJames C. Gerrit Heckaliff, MD,  <Dictator> Winn JockJAMES C Breanda Greenlaw MD ELECTRONICALLY SIGNED 03/18/2011 5:56

## 2014-10-02 IMAGING — CR DG CHEST 1V PORT
1 series · 1 of 1 positions shown · non-contrast
Comparison: none

REASON FOR EXAM: weakness
COMMENTS:

PROCEDURE:     DXR - DXR PORTABLE CHEST SINGLE VIEW  - May 01, 2012  [DATE]
RESULT:     Comparison: 03/11/2011

[ap]
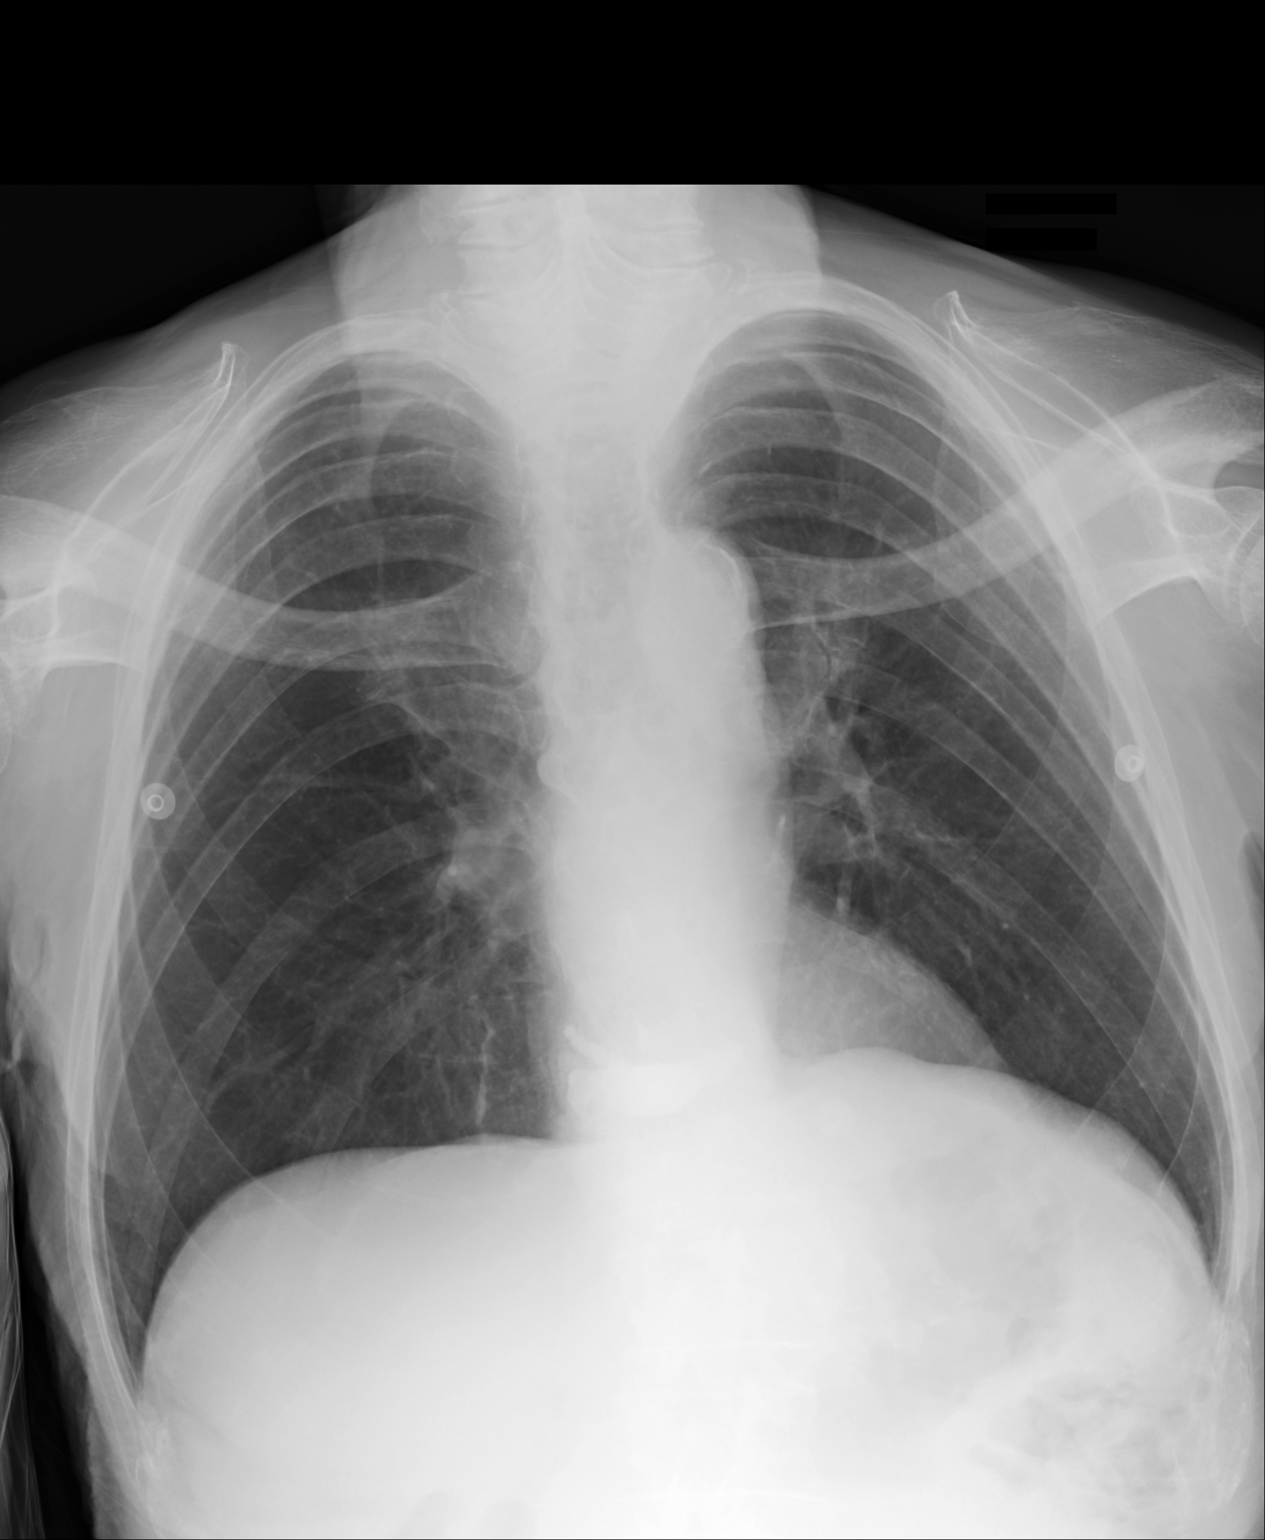

[1 of 1 positions shown; findings below may reference images not displayed]

FINDINGS: The heart and mediastinum are stable, given relative kyphotic positioning on
today's study. No focal pulmonary opacities.
IMPRESSION: No acute cardiopulmonary disease.

[REDACTED]

## 2016-03-19 DEATH — deceased
